# Patient Record
Sex: Female | Born: 1980 | Hispanic: Yes | Marital: Single | Smoking: Never smoker
Health system: Southern US, Community
[De-identification: ages and names within clinical notes are randomized; demographics above are authoritative.]

## PROBLEM LIST (undated history)

## (undated) DIAGNOSIS — R87629 Unspecified abnormal cytological findings in specimens from vagina: Secondary | ICD-10-CM

## (undated) DIAGNOSIS — R519 Headache, unspecified: Secondary | ICD-10-CM

## (undated) HISTORY — DX: Unspecified abnormal cytological findings in specimens from vagina: R87.629

---

## 2016-03-29 ENCOUNTER — Encounter (HOSPITAL_COMMUNITY): Payer: Self-pay | Admitting: *Deleted

## 2016-04-14 ENCOUNTER — Encounter (HOSPITAL_COMMUNITY): Payer: Self-pay | Admitting: *Deleted

## 2016-04-14 ENCOUNTER — Ambulatory Visit (HOSPITAL_COMMUNITY)
Admission: RE | Admit: 2016-04-14 | Discharge: 2016-04-14 | Disposition: A | Discharge status: Home / Self Care | Payer: Self-pay | Source: Ambulatory Visit | Attending: Obstetrics and Gynecology | Admitting: Obstetrics and Gynecology

## 2016-04-14 VITALS — BP 120/84 | Temp 98.9°F | Ht 63.0 in | Wt 143.8 lb

## 2016-04-14 DIAGNOSIS — R87612 Low grade squamous intraepithelial lesion on cytologic smear of cervix (LGSIL): Secondary | ICD-10-CM

## 2016-04-14 DIAGNOSIS — Z1239 Encounter for other screening for malignant neoplasm of breast: Secondary | ICD-10-CM

## 2016-04-14 NOTE — Progress Notes (Signed)
[facility] due to having an abnormal Pap smear and a colposcopy is recommended for follow up. Pap smear completed on 03/11/2016.  Pap Smear:  Pap smear not completed today. Last [facility]2016 [facility] and LGSIL with cells suspicious of HGSIL. Referred patient to the Center [facility] for a colposcopy to follow up for abnormal Pap smear. Appointment scheduled for Monday, May 02, 2016 at 1440. Per patient has no history of an abnormal Pap smear prior to the most recent Pap smear. Last Pap smear result is in EPIC.  Physical exam: Breasts Breasts symmetrical. No skin abnormalities bilateral breasts. No nipple retraction bilateral breasts. No nipple discharge bilateral breasts. No lymphadenopathy. No lumps palpated bilateral breasts. No complaints of pain or tenderness on exam. Screening mammogram recommended at age 740 unless clinically indicated prior.     Pelvic/Bimanual No Pap smear completed today since last Pap smear was 03/11/2016. Pap smear not indicated per BCCCP guidelines.   Smoking History: Patient has never smoked.  Patient Navigation: Patient education provided. Access to services provided for patient through [facility]. Spanish interpreter provided.  Used Spanish interpreter [employer] Herrera from [facility].

## 2016-04-14 NOTE — Patient Instructions (Signed)
Explained breast self awareness with Sheila Ellis. Patient did not need a Pap smear today due to last Pap smear was 03/11/2016. Explained the colposcopy the needed follow up for her abnormal Pap smear. Referred patient to the [facility] for a colposcopy to follow up for abnormal Pap smear. Appointment scheduled for Monday, May 02, 2016 at 1440. Patient aware of appointment and will be there. Let patient know will follow up with her within the next couple weeks with results. [name] verbalized understanding.  Brannock, Kathaleen Maserhristine Poll, RN 4:44 PM

## 2016-04-18 ENCOUNTER — Encounter (HOSPITAL_COMMUNITY): Payer: Self-pay | Admitting: *Deleted

## 2016-05-02 ENCOUNTER — Encounter: Payer: Self-pay | Admitting: Obstetrics and Gynecology

## 2016-05-19 ENCOUNTER — Other Ambulatory Visit (HOSPITAL_COMMUNITY)
Admission: RE | Admit: 2016-05-19 | Discharge: 2016-05-19 | Disposition: A | Discharge status: Home / Self Care | Payer: Self-pay | Source: Ambulatory Visit | Attending: Obstetrics and Gynecology | Admitting: Obstetrics and Gynecology

## 2016-05-19 ENCOUNTER — Ambulatory Visit (INDEPENDENT_AMBULATORY_CARE_PROVIDER_SITE_OTHER): Payer: Self-pay | Admitting: Obstetrics and Gynecology

## 2016-05-19 ENCOUNTER — Encounter: Payer: Self-pay | Admitting: Obstetrics and Gynecology

## 2016-05-19 VITALS — BP 138/93 | HR 84 | Wt 141.9 lb

## 2016-05-19 DIAGNOSIS — Z3202 Encounter for pregnancy test, result negative: Secondary | ICD-10-CM

## 2016-05-19 DIAGNOSIS — R87612 Low grade squamous intraepithelial lesion on cytologic smear of cervix (LGSIL): Secondary | ICD-10-CM

## 2016-05-19 LAB — POCT PREGNANCY, URINE: Preg Test, Ur: NEGATIVE

## 2016-05-19 NOTE — Progress Notes (Signed)
GYNECOLOGY CLINIC COLPOSCOPY VISIT AND PROCEDURE NOTE  35 y.o. G0P0000 here for colposcopy for LSIL concern for high grade  pap smear on 03/17/2016. Prior cervical cytology and/or colposcopy findings: never had PAP before. The patient reports the following prior treatments to the vulva/vagina/cervix: none. The patient not a cigarette smoker. The patient not immunosuppressed. The patient not pregnant. The patient not taking anticoagulants and not allergic to iodine.  Patient given informed consent, signed copy in the chart, time out was performed. Urine pregnancy test performed and confirmed to be negative.  Placed in lithotomy position.   Gross findings:   Vagina: normal  Vulva: normal  Cervix: abnormal see diagram, rolling edges with layered lesions, and abnormal vessels from 12 to 3 oclock  Visualization after:  Acetic acid: increased uptake near 12 oclock  Lugol's solution: normal uptake  Physical Exam  Genitourinary:         Biopsies obtained: 8 oclock, 10 oclock and 2 oclock  ECC specimen obtained: yes  Colposcopy adequate? Yes  All specimens were labelled and sent to pathology.   Patient was given post procedure instructions.  Will follow up pathology and manage accordingly.      Ernestina PennaNicholas Schenk, MD OB/GYN Fellow

## 2016-05-19 NOTE — Patient Instructions (Signed)
Colposcopa, Cuidado posterior (Colposcopy, Care After) La colposcopa es un procedimiento en el que se utiliza una herramienta especial para magnificar la superficie del cuello del tero. Tambin es posible que se tome una muestra de tejido (biopsia). Esta muestra se observar para identificar la presencia de cncer cervical u otros problemas. Despus del procedimiento:  Podr sentir algunos clicos.  Recustese algunos minutos si se siente mareada.  Podr tener un sangrado que debera detenerse luego de algunos das. CUIDADOS EN EL HOGAR  No tenga relaciones sexuales ni use tampones durante 2 o 3 das o segn le hayan indicado.  Slo tome medicamentos como lo indique su mdico.  Contine tomando las pastillas anticonceptivas de la forma habitual. Averige los resultados de su anlisis Pregunte cundo estarn listos los resultados del examen. Asegrese de obtener los resultados. SOLICITE AYUDA DE INMEDIATO SI:  Tiene un sangrado abundante o elimina cogulos.  Su temperatura es de 102 F (38.9 C) o mayor.  Observa una secrecin vaginal anormal.  Tiene clicos que no se van con los medicamentos.  Siente mareos, vrtigo o pierde el conocimiento (se desmaya). ASEGRESE DE QUE:   Comprende estas instrucciones.  Controlar su enfermedad.  Solicitar ayuda de inmediato si no mejora o si empeora.   Esta informacin no tiene como fin reemplazar el consejo del mdico. Asegrese de hacerle al mdico cualquier pregunta que tenga.   Document Released: 07/30/2010 Document Revised: 09/19/2011 Elsevier Interactive Patient Education 2016 Elsevier Inc.  

## 2016-05-30 ENCOUNTER — Telehealth: Payer: Self-pay | Admitting: Obstetrics and Gynecology

## 2016-05-30 NOTE — Telephone Encounter (Signed)
Patient with high grade lesion on her Colposcopy. Will need LEEP with large Fischer loop. Left message for patient to call back for results. If she calls back she will need scheduled for LEEP. This is per Dr. Vergie LivingPickens. Please schedule with me if possible.

## 2016-06-01 ENCOUNTER — Telehealth: Payer: Self-pay | Admitting: *Deleted

## 2016-06-01 NOTE — Telephone Encounter (Signed)
-----   Message from Lorne SkeensNicholas Michael Schenk, MD sent at 05/30/2016  3:08 PM EST ----- Attempted to reach patient with results left message to call back. High grade lesion on colposcopy. Will need LEEP. Please schedule with me. D/W Dr. Vergie LivingPickens. Please let me know if she has questions and I will attempt to reach.

## 2016-06-01 NOTE — Telephone Encounter (Signed)
Per Dr Genevie AnnSchenk, attempted to contact pt using Spanish interpreter # [phone]. There was no answer. Left message stating I am calling with test results. Please call the clinic.

## 2016-06-06 NOTE — Telephone Encounter (Signed)
Called Sheila Ellis with interpreter Maretta LosBlanca Lindner and explained colposcopy came back high grade and reccommendation is LEEP and that a registrar will call her with an appointment. She voices understanding.

## 2016-06-28 ENCOUNTER — Encounter: Payer: Self-pay | Admitting: Obstetrics and Gynecology

## 2016-07-06 ENCOUNTER — Telehealth: Payer: Self-pay | Admitting: *Deleted

## 2016-07-06 NOTE — Telephone Encounter (Signed)
error 

## 2016-08-10 ENCOUNTER — Encounter: Payer: Self-pay | Admitting: Obstetrics and Gynecology

## 2016-08-10 ENCOUNTER — Ambulatory Visit (INDEPENDENT_AMBULATORY_CARE_PROVIDER_SITE_OTHER): Payer: Self-pay | Admitting: Obstetrics and Gynecology

## 2016-08-10 ENCOUNTER — Other Ambulatory Visit (HOSPITAL_COMMUNITY)
Admission: RE | Admit: 2016-08-10 | Discharge: 2016-08-10 | Disposition: A | Discharge status: Home / Self Care | Payer: Self-pay | Source: Ambulatory Visit | Attending: Obstetrics and Gynecology | Admitting: Obstetrics and Gynecology

## 2016-08-10 VITALS — BP 151/84 | HR 79 | Wt 147.5 lb

## 2016-08-10 DIAGNOSIS — Z3202 Encounter for pregnancy test, result negative: Secondary | ICD-10-CM

## 2016-08-10 DIAGNOSIS — N871 Moderate cervical dysplasia: Secondary | ICD-10-CM

## 2016-08-10 LAB — POCT PREGNANCY, URINE
PREG TEST UR: NEGATIVE
Preg Test, Ur: NEGATIVE

## 2016-08-10 NOTE — Patient Instructions (Signed)
Conización del cuello del útero, cuidados posteriores °(Conization of the Cervix, Care After) °Estas indicaciones le proporcionan información acerca de cómo deberá cuidarse después del procedimiento. El médico también podrá darle instrucciones específicas. Comuníquese con el médico si tiene algún problema o tiene preguntas después del procedimiento. °CUIDADOS EN EL HOGAR °· Planifique para que alguien la lleve de vuelta a su casa después del procedimiento. °· Solo tome los medicamentos que le haya indicado su médico. No tome aspirina. Puede ocasionar hemorragias. °· Durante la primera semana tome duchas. No tome baños, no practique natación ni use el jacuzzi hasta que el médico la autorice. °· No se haga duchas vaginales, no utilice tampones ni tenga sexo (relaciones sexuales) hasta que el médico la autorice. °· Durante 7 a 14 días, evite: °? El exceso de actividad. °? Practicar actividad física. °? Levantar peso excesivo. °· Vuelva a su dieta habitual excepto que el médico le indique otra cosa. °· Si no puede mover el intestino (está estreñida), puede: °? Tomar un laxante suave según las indicaciones del médico. °? Agregar frutas y salvado a su dieta. °? Asegurarse de ingerir gran cantidad de líquido para mantener el pis (orina) de tono claro o color amarillo pálido. °· Cumpla con las visitas de control según le indique su médico. ° °SOLICITE AYUDA SI: °· Tiene una erupción cutánea. °· Se siente mareada o sufre un desmayo. °· Siente malestar estomacal (náuseas). °· Tiene una secreción vaginal con mal olor. ° °SOLICITE AYUDA DE INMEDIATO SI: °· Tiene coágulos de sangre o una hemorragia más abundante que un período menstrual normal. Por ejemplo, empapa un apósito en menos de 1 hora. °· Tiene un sangrado rojo brillante. °· Tiene fiebre de más de 101 °F (38,3 °C) o síntomas persistentes durante más de 2 o 3 días. °· Tiene fiebre de más de 101 °F (38,3 °C) y los síntomas empeoran repentinamente. °· Siente cada vez más  cólicos. °· Pierde el conocimiento (se desmaya). °· Siente dolor al orinar. °· La orina tiene sangre. °· Comienza a devolver (vomita). °· El dolor no mejora al tomar los medicamentos. °· Siente mucho dolor. °· El dolor empeora. ° °ASEGÚRESE DE QUE: °· Comprende estas instrucciones. °· Controlará su afección. °· Recibirá ayuda de inmediato si no mejora o si empeora. ° °Esta información no tiene como fin reemplazar el consejo del médico. Asegúrese de hacerle al médico cualquier pregunta que tenga. °Document Released: 07/30/2010 Document Revised: 07/02/2013 Document Reviewed: 12/21/2012 °Elsevier Interactive Patient Education © 2017 Elsevier Inc. ° °

## 2016-08-10 NOTE — Progress Notes (Signed)
   GYNECOLOGY OFFICE PROCEDURE NOTE  Sheila Ellis is a 36 y.o. G0P0000 here for LEEP. No GYN concerns. Pap smear and colposcopy reviewed.    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  Role of HPV,cervical dysplasia and need for close followup was empasized. Informed written consent was obtained. All questions were answered. Time out performed. Urine pregnancy test was negative.  Procedure: The patient was placed in lithotomy position and the bivalved coated speculum was placed in the patient's vagina. A grounding pad placed on the patient. Lugol's solution was applied to the cervix and areas of decreased uptake were noted around the transformation zone.   Local anesthesia was administered via an intracervical block using 10cc of 2% Lidocaine with epinephrine. The suction was turned on and the Medium 1X Fisher Cone Biopsy Excisor on 50 Watts of blended current was used to excise the area of decreased uptake and excise the entire transformation zone. Excellent hemostasis was achieved using roller ball coagulation set at 50 Watts coagulation current. Monsel's solution was then applied and the speculum was removed from the vagina. Specimens were sent to pathology.  The patient tolerated the procedure well. Post-operative instructions given to patient, including instruction to seek medical attention for persistent bright red bleeding, fever, abdominal/pelvic pain, dysuria, nausea or vomiting. She was also told about the possibility of having copious yellow to black tinged discharge for weeks. She was counseled to avoid anything in the vagina (sex/douching/tampons) for 3 weeks. She has a 4 week post-operative check  to assess wound healing, review results and discuss further management.     Ernestina PennaNicholas Lamondre Wesche, MD OB Fellow, [facility]

## 2016-08-10 NOTE — Addendum Note (Signed)
Addended by: Garret ReddishBARNES, Iris Hairston M on: 08/10/2016 04:39 PM   Modules accepted: Orders

## 2016-08-17 ENCOUNTER — Telehealth: Payer: Self-pay | Admitting: Obstetrics and Gynecology

## 2016-08-17 NOTE — Telephone Encounter (Signed)
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 informed she will need to get repeat PAP with co-testing at 12 and 24 months. Patient voiced understanding.

## 2016-08-23 ENCOUNTER — Telehealth (HOSPITAL_COMMUNITY): Payer: Self-pay | Admitting: *Deleted

## 2016-08-23 NOTE — Telephone Encounter (Signed)
Telephoned patient at home number and advised patient would need to fill out financial assistance  paperwork for LEEP procedurebecasue was not covered by BCCCP. Patient is not citizen and is not eligible for [employer] medicaid. Patient voiced understanding. Used interpreter Delorise RoyalsJulie Sowell.

## 2017-07-11 HISTORY — PX: DILATION AND CURETTAGE OF UTERUS: SHX78

## 2017-11-07 ENCOUNTER — Ambulatory Visit (HOSPITAL_COMMUNITY): Payer: Self-pay

## 2019-09-17 ENCOUNTER — Emergency Department
Admission: EM | Admit: 2019-09-17 | Discharge: 2019-09-17 | Disposition: A | Discharge status: Home / Self Care | Payer: Self-pay | Attending: Emergency Medicine | Admitting: Emergency Medicine

## 2019-09-17 ENCOUNTER — Encounter: Payer: Self-pay | Admitting: Emergency Medicine

## 2019-09-17 ENCOUNTER — Emergency Department: Payer: Self-pay

## 2019-09-17 ENCOUNTER — Other Ambulatory Visit: Payer: Self-pay

## 2019-09-17 ENCOUNTER — Other Ambulatory Visit: Payer: Self-pay | Admitting: Podiatry

## 2019-09-17 DIAGNOSIS — Z23 Encounter for immunization: Secondary | ICD-10-CM | POA: Insufficient documentation

## 2019-09-17 DIAGNOSIS — X501XXA Overexertion from prolonged static or awkward postures, initial encounter: Secondary | ICD-10-CM | POA: Insufficient documentation

## 2019-09-17 DIAGNOSIS — S98131A Complete traumatic amputation of one right lesser toe, initial encounter: Secondary | ICD-10-CM | POA: Insufficient documentation

## 2019-09-17 DIAGNOSIS — Y998 Other external cause status: Secondary | ICD-10-CM | POA: Insufficient documentation

## 2019-09-17 DIAGNOSIS — Y92012 Bathroom of single-family (private) house as the place of occurrence of the external cause: Secondary | ICD-10-CM | POA: Insufficient documentation

## 2019-09-17 DIAGNOSIS — Y9389 Activity, other specified: Secondary | ICD-10-CM | POA: Insufficient documentation

## 2019-09-17 MED ORDER — TETANUS-DIPHTH-ACELL PERTUSSIS 5-2.5-18.5 LF-MCG/0.5 IM SUSP
0.5000 mL | Freq: Once | INTRAMUSCULAR | Status: AC
  Administered 2019-09-17: 11:00:00 0.5 mL via INTRAMUSCULAR
  Filled 2019-09-17: qty 0.5

## 2019-09-17 MED ORDER — OXYCODONE-ACETAMINOPHEN 5-325 MG PO TABS
1.0000 | ORAL_TABLET | Freq: Once | ORAL | Status: AC
  Administered 2019-09-17: 10:00:00 1 via ORAL
  Filled 2019-09-17: qty 1

## 2019-09-17 MED ORDER — AMOXICILLIN-POT CLAVULANATE 875-125 MG PO TABS
1.0000 | ORAL_TABLET | Freq: Once | ORAL | Status: AC
  Administered 2019-09-17: 11:00:00 1 via ORAL
  Filled 2019-09-17: qty 1

## 2019-09-17 MED ORDER — AMOXICILLIN-POT CLAVULANATE 875-125 MG PO TABS: 1.0000 | ORAL_TABLET | Freq: Two times a day (BID) | ORAL | 0 refills | Status: DC

## 2019-09-17 NOTE — ED Provider Notes (Signed)
[facility] Emergency Department Provider Note ____________________________________________   First MD Initiated Contact with Patient 09/17/19 [phone]     (approximate)  I have reviewed the triage vital signs and the nursing notes.   HISTORY  Chief Complaint Foot Injury  History of present illness and review of systems obtained via in-person Spanish interpreter  HPI Sheila Ellis is a 39 y.o. female who presents with right second toe injury, acute onset when the patient slipped while getting out of the shower and the foot went into a bent in the floor.  When the patient her foot out, the vents were off a piece of the second toe.  She denies any other injuries.  Past Medical History:  Diagnosis Date  . Vaginal Pap smear, abnormal     There are no problems to display for this patient.   History reviewed. No pertinent surgical history.  Prior to Admission medications   Medication Sig Start Date End Date Taking? Authorizing Provider  acetaminophen (TYLENOL) 500 MG tablet Take 500 mg by mouth every 6 (six) hours as needed for mild pain, moderate pain or headache.    [provider]  amoxicillin-clavulanate (AUGMENTIN) 875-125 MG tablet Take 1 tablet by mouth 2 (two) times daily for 7 days. 09/17/19 09/24/19  Arta Silence, MD  medroxyPROGESTERone (DEPO-PROVERA) 400 MG/ML SUSP injection Inject into the muscle once.    [provider]  Multiple Vitamin (MULTIVITAMIN) tablet Take 1 tablet by mouth daily.    [provider]    Allergies Patient has no known allergies.  Family History  Problem Relation Age of Onset  . Diabetes Mother   . Cancer Mother        LIVER    Social History Social History   Tobacco Use  . Smoking status: Never Smoker  . Smokeless tobacco: Never Used  Substance Use Topics  . Alcohol use: Yes    Comment: RARELY  . Drug use: No    Review of Systems  Constitutional: No fever. Eyes: No  redness. ENT: No neck pain. Cardiovascular: Denies chest pain. Respiratory: Denies shortness of breath. Gastrointestinal: No vomiting. Genitourinary: Negative for flank pain.  Musculoskeletal: Negative for back pain. Skin: Negative for rash. Neurological: Negative for headache.   ____________________________________________   PHYSICAL EXAM:  VITAL SIGNS: ED Triage Vitals  Enc Vitals Group     BP 09/17/19 0858 97/71     Pulse Rate 09/17/19 0858 96     Resp 09/17/19 0858 16     Temp 09/17/19 0858 98.4 F (36.9 C)     Temp Source 09/17/19 0858 Oral     SpO2 09/17/19 0858 98 %     Weight 09/17/19 0859 180 lb (81.6 kg)     Height 09/17/19 0859 5\' 5"  (1.651 m)     Head Circumference --      Peak Flow --      Pain Score 09/17/19 0859 9     Pain Loc --      Pain Edu? --      Excl. in [place]? --     Constitutional: Alert and oriented. Well appearing and in no acute distress. Eyes: Conjunctivae are normal.  Head: Atraumatic. Nose: No congestion/rhinnorhea. Mouth/Throat: Mucous membranes are moist.   Neck: Normal range of motion.  Cardiovascular: Normal rate, regular rhythm.  Good peripheral circulation. Respiratory: Normal respiratory effort.  No retractions.  Gastrointestinal: No distention.  Musculoskeletal: Extremities warm and well perfused.  Right second toe with avulsion of  the distal soft tissue with phalanx exposed. Neurologic:  Normal speech and language. No gross focal neurologic deficits are appreciated.  Skin:  Skin is warm and dry. No rash noted. Psychiatric: Mood and affect are normal. Speech and behavior are normal.  ____________________________________________   LABS (all labs ordered are listed, but only abnormal results are displayed)  Labs Reviewed - No data to display ____________________________________________  EKG   ____________________________________________  RADIOLOGY  XR R foot: Soft tissue and distal phalanx avulsion consistent  mutation  ____________________________________________   PROCEDURES  Procedure(s) performed: No  Procedures  Critical Care performed: No ____________________________________________   INITIAL IMPRESSION / ASSESSMENT AND PLAN / ED COURSE  Pertinent labs & imaging results that were available during my care of the patient were reviewed by me and considered in my medical decision making (see chart for details).  39 year old female with PMH as noted above presents with right second toe avulsion, with exposed bone.  She has no other injuries.  We will obtain an x-ray, give analgesia, and consult podiatry.  ----------------------------------------- 11:41 AM on 09/17/2019 -----------------------------------------  X-ray confirms avulsion of the distal end of the distal phalanx.  I consulted Dr. Excell Seltzer from the podiatry service.  He recommended that we washed out the wound thoroughly, start the patient on an antibiotic, and have her proceed directly over to their clinic to see Dr. Ether Griffins this afternoon and be scheduled for condition with next 1 to 2 days.  The patient was given Augmentin and a tetanus shot in the ED.  The wound was thoroughly irrigated and wrapped in a sterile dressing by the RN.  The patient was then instructed to proceed to podiatry.  Discharge instructions were given via in-person Spanish interpreter and the patient expressed understanding.  Answered all of her questions.  ____________________________________________   FINAL CLINICAL IMPRESSION(S) / ED DIAGNOSES  Final diagnoses:  Amputated toe of right foot (HCC)      NEW MEDICATIONS STARTED DURING THIS VISIT:  New Prescriptions   AMOXICILLIN-CLAVULANATE (AUGMENTIN) 875-125 MG TABLET    Take 1 tablet by mouth 2 (two) times daily for 7 days.     Note:  This document was prepared using Dragon voice recognition software and may include unintentional dictation errors.    Dionne Bucy, MD 09/17/19  [phone]

## 2019-09-17 NOTE — ED Triage Notes (Signed)
Pt in via POV, reports slipping while coming out of the shower, foot becoming lodged into AC vent.  Tip of right second toe completely severed, bone protruding, bleeding controlled at this time.

## 2019-09-17 NOTE — ED Notes (Signed)
Xray and MD at bedside.

## 2019-09-17 NOTE — ED Notes (Signed)
Pt affected toe cleaned with sterile saline and betadine per MD Siadecki. Toe wrapped in xeroform and guaze. No active bleeding at the site.

## 2019-09-18 ENCOUNTER — Other Ambulatory Visit
Admission: RE | Admit: 2019-09-18 | Discharge: 2019-09-18 | Disposition: A | Discharge status: Home / Self Care | Payer: HRSA Program | Source: Ambulatory Visit | Attending: Podiatry | Admitting: Podiatry

## 2019-09-18 DIAGNOSIS — Z20822 Contact with and (suspected) exposure to covid-19: Secondary | ICD-10-CM | POA: Insufficient documentation

## 2019-09-18 LAB — SARS CORONAVIRUS 2 (TAT 6-24 HRS): SARS Coronavirus 2: NEGATIVE

## 2019-09-19 ENCOUNTER — Other Ambulatory Visit: Payer: Self-pay

## 2019-09-19 ENCOUNTER — Encounter
Admission: RE | Admit: 2019-09-19 | Discharge: 2019-09-19 | Disposition: A | Discharge status: Home / Self Care | Payer: Self-pay | Source: Ambulatory Visit | Attending: Podiatry | Admitting: Podiatry

## 2019-09-19 DIAGNOSIS — Z01818 Encounter for other preprocedural examination: Secondary | ICD-10-CM | POA: Insufficient documentation

## 2019-09-19 HISTORY — DX: Headache, unspecified: R51.9

## 2019-09-19 NOTE — Patient Instructions (Signed)
Your procedure is scheduled on: Friday September 20, 2019 Su procedimiento est programado para: Viernes 12 de Sheila Ellis del 2021 Report to Day Surgery. Presntese a: Sheila Ellis  To find out your arrival time please call [phone] between 1PM - 3PM on Thursday September 20, 2019. Para saber su hora de llegada por favor llame al [phone] Sheila Ellis Me la 1PM - 3PM el da: [name] 12 de [name] del 2021.   Remember: Instructions that are not followed completely may result in serious medical risk, up to and including death,  or upon the discretion of your surgeon and anesthesiologist your surgery may need to be rescheduled.  Recuerde: Las instrucciones que no se siguen completamente Armed forces logistics/support/administrative officer en un riesgo de salud grave, incluyendo hasta  la [place] o a discrecin de su cirujano y Scientific laboratory technician, su ciruga se puede posponer.   __X_ 1.Do not eat food after midnight the night before your procedure. No    gum chewing or hard candies. You may drink clear liquids up to 2 hours     before you are scheduled to arrive for your surgery- DO not drink clear     Liquids within 2 hours of the start of your surgery.     Clear Liquids include:    water, apple juice without pulp, clear carbohydrate drink such as    Clearfast of [place], Black Coffee or Tea (Do not add anything to coffee or tea).   NO sodas or carbonated drinks.      No coma nada despus de la medianoche de la noche anterior a su    procedimiento. No coma chicles ni caramelos duros. Puede tomar    lquidos claros hasta 2 horas antes de su hora programada de llegada al     hospital para su procedimiento. No tome lquidos claros durante el     transcurso de las 2 horas de su llegada programada al hospital para su     procedimiento, ya que esto puede llevar a que su procedimiento se    retrase o tenga que volver a Magazine features editor.  Los lquidos claros incluyen:          - Agua o jugo de [place] sin pulpa          - Bebidas claras con  carbohidratos como ClearFast o Gatorade          - Caf negro o t claro (sin leche, sin cremas, no agregue nada al caf ni al t)  No tome nada que no est en esta lista.  Los pacientes con diabetes tipo 1 y tipo 2 solo deben Printmaker.  Llame a la clnica de PreCare o a la unidad de Same Day Surgery si  tiene alguna pregunta sobre estas instrucciones. NO refrescos o bedidas carbonadas.              _X__ 2.Do Not Smoke or use e-cigarettes For 24 Hours Prior to Your Surgery.    Do not use any chewable tobacco products for at least 6   hours prior to surgery.    No fume ni use cigarrillos electrnicos durante las 24 horas previas    a su [place].  No use ningn producto de tabaco masticable durante   al menos 6 horas antes de la [place].     __X_ 3. No alcohol for 24 hours before or after surgery.    No tome alcohol durante las 24 horas antes ni despus de la [place].   __x__ 5. Notify your doctor if there  is any change in your medical condition (cold,fever, infections).    Informe a su mdico si hay algn cambio en su condicin mdica  (resfriado, fiebre, infecciones).   Do not wear jewelry, make-up, hairpins, clips or nail polish.  No use joyas, maquillajes, pinzas/ganchos para el cabello ni esmalte de uas.  Do not wear lotions, powders, or perfumes. You may wear deodorant.  No use lociones, polvos o perfumes.  Puede usar desodorante.    Do not shave 48 hours prior to surgery. Men may shave face and neck.  No se afeite 48 horas antes de la [place].  Los hombres pueden [employer] cara  y el cuello.   Do not bring valuables to the hospital.   No lleve objetos [place] is not responsible for any belongings or valuables.  [place] no se hace responsable de ningn tipo de pertenencias u objetos de Geographical information systems officer.               Contacts, dentures or bridgework may not be worn into surgery.  Los lentes de [place], las dentaduras postizas o puentes no se pueden  usar en la [place].   For patients admitted to the hospital, discharge time is determined by your  treatment team.  Para los pacientes que sean ingresados al hospital, el tiempo en el cual se le  dar de alta es determinado por su equipo de [place].   Patients discharged the day of surgery will not be allowed to drive home. A los pacientes que se les da de alta el mismo da de la ciruga no se les permitir conducir a Holiday representative.     __x__ Take these medicines the morning of surgery with A SIP OF WATER:          Tome estas medicinas la maana de la ciruga con UN SORBO DE AGUA:  1. acetaminophen (TYLENOL)   __x__ Use CHG Soap as directed          Utilice el jabn de CHG segn lo indicado   __x__ Stop Anti-inflammatories on ibuprofen, aspirin, Aleve, polvos de BC powders.           Deje de tomar Regulatory affairs officer:   __x__ Stop supplements until after surgery            Deje de tomar suplementos hasta despus de la ciruga  __x__ Do not start herbal supplements before your surgery.  No empieze a tomar suplementos de hierbas antes de la [place].

## 2019-09-20 ENCOUNTER — Ambulatory Visit
Admission: RE | Admit: 2019-09-20 | Discharge: 2019-09-20 | Disposition: A | Discharge status: Home / Self Care | Payer: Self-pay | Attending: Podiatry | Admitting: Podiatry

## 2019-09-20 ENCOUNTER — Other Ambulatory Visit: Payer: Self-pay

## 2019-09-20 ENCOUNTER — Ambulatory Visit: Payer: Self-pay | Admitting: Certified Registered Nurse Anesthetist

## 2019-09-20 ENCOUNTER — Encounter: Payer: Self-pay | Admitting: Podiatry

## 2019-09-20 ENCOUNTER — Encounter
Admission: RE | Disposition: A | Discharge status: Home / Self Care | Payer: Self-pay | Source: Home / Self Care | Attending: Podiatry

## 2019-09-20 DIAGNOSIS — Z79899 Other long term (current) drug therapy: Secondary | ICD-10-CM | POA: Insufficient documentation

## 2019-09-20 DIAGNOSIS — Y9389 Activity, other specified: Secondary | ICD-10-CM | POA: Insufficient documentation

## 2019-09-20 DIAGNOSIS — R519 Headache, unspecified: Secondary | ICD-10-CM | POA: Insufficient documentation

## 2019-09-20 DIAGNOSIS — Z8249 Family history of ischemic heart disease and other diseases of the circulatory system: Secondary | ICD-10-CM | POA: Insufficient documentation

## 2019-09-20 DIAGNOSIS — S92531B Displaced fracture of distal phalanx of right lesser toe(s), initial encounter for open fracture: Secondary | ICD-10-CM | POA: Insufficient documentation

## 2019-09-20 DIAGNOSIS — Z800 Family history of malignant neoplasm of digestive organs: Secondary | ICD-10-CM | POA: Insufficient documentation

## 2019-09-20 DIAGNOSIS — Z833 Family history of diabetes mellitus: Secondary | ICD-10-CM | POA: Insufficient documentation

## 2019-09-20 DIAGNOSIS — X58XXXA Exposure to other specified factors, initial encounter: Secondary | ICD-10-CM | POA: Insufficient documentation

## 2019-09-20 HISTORY — PX: AMPUTATION TOE: SHX6595

## 2019-09-20 LAB — POCT PREGNANCY, URINE: Preg Test, Ur: NEGATIVE

## 2019-09-20 SURGERY — AMPUTATION, TOE
Anesthesia: General | Site: Toe | Laterality: Right

## 2019-09-20 MED ORDER — EPHEDRINE SULFATE 50 MG/ML IJ SOLN
INTRAMUSCULAR | Status: DC | PRN
  Administered 2019-09-20: 5 mg via INTRAVENOUS

## 2019-09-20 MED ORDER — OXYCODONE HCL 5 MG PO TABS: 5.0000 mg | ORAL_TABLET | Freq: Once | ORAL | Status: DC | PRN

## 2019-09-20 MED ORDER — ACETAMINOPHEN 10 MG/ML IV SOLN
INTRAVENOUS | Status: DC | PRN
  Administered 2019-09-20: 1000 mg via INTRAVENOUS

## 2019-09-20 MED ORDER — FAMOTIDINE 20 MG PO TABS: 20.0000 mg | ORAL_TABLET | Freq: Once | ORAL | Status: AC

## 2019-09-20 MED ORDER — CEFAZOLIN SODIUM-DEXTROSE 2-4 GM/100ML-% IV SOLN
INTRAVENOUS | Status: AC
  Filled 2019-09-20: qty 100

## 2019-09-20 MED ORDER — CLINDAMYCIN HCL 300 MG PO CAPS: 300.0000 mg | ORAL_CAPSULE | Freq: Three times a day (TID) | ORAL | 0 refills | Status: AC

## 2019-09-20 MED ORDER — ONDANSETRON HCL 4 MG/2ML IJ SOLN: 4.0000 mg | Freq: Once | INTRAMUSCULAR | Status: DC | PRN

## 2019-09-20 MED ORDER — MIDAZOLAM HCL 2 MG/2ML IJ SOLN
INTRAMUSCULAR | Status: DC | PRN
  Administered 2019-09-20: 2 mg via INTRAVENOUS

## 2019-09-20 MED ORDER — LACTATED RINGERS IV SOLN: INTRAVENOUS | Status: DC

## 2019-09-20 MED ORDER — FENTANYL CITRATE (PF) 100 MCG/2ML IJ SOLN
INTRAMUSCULAR | Status: AC
  Filled 2019-09-20: qty 2

## 2019-09-20 MED ORDER — PROPOFOL 10 MG/ML IV BOLUS
INTRAVENOUS | Status: DC | PRN
  Administered 2019-09-20: 40 mg via INTRAVENOUS

## 2019-09-20 MED ORDER — CLINDAMYCIN PHOSPHATE 600 MG/50ML IV SOLN
600.0000 mg | Freq: Once | INTRAVENOUS | Status: AC
  Administered 2019-09-20: 600 mg via INTRAVENOUS

## 2019-09-20 MED ORDER — BUPIVACAINE HCL (PF) 0.5 % IJ SOLN
INTRAMUSCULAR | Status: AC
  Filled 2019-09-20: qty 30

## 2019-09-20 MED ORDER — ACETAMINOPHEN 10 MG/ML IV SOLN
INTRAVENOUS | Status: AC
  Filled 2019-09-20: qty 100

## 2019-09-20 MED ORDER — BUPIVACAINE HCL 0.5 % IJ SOLN
INTRAMUSCULAR | Status: DC | PRN
  Administered 2019-09-20: 5 mL

## 2019-09-20 MED ORDER — PROPOFOL 10 MG/ML IV BOLUS
INTRAVENOUS | Status: AC
  Filled 2019-09-20: qty 20

## 2019-09-20 MED ORDER — CEFAZOLIN SODIUM-DEXTROSE 2-4 GM/100ML-% IV SOLN: 2.0000 g | INTRAVENOUS | Status: DC

## 2019-09-20 MED ORDER — OXYCODONE HCL 5 MG/5ML PO SOLN: 5.0000 mg | Freq: Once | ORAL | Status: DC | PRN

## 2019-09-20 MED ORDER — FENTANYL CITRATE (PF) 100 MCG/2ML IJ SOLN: 25.0000 ug | INTRAMUSCULAR | Status: DC | PRN

## 2019-09-20 MED ORDER — ONDANSETRON HCL 4 MG/2ML IJ SOLN: 4.0000 mg | Freq: Four times a day (QID) | INTRAMUSCULAR | Status: DC | PRN

## 2019-09-20 MED ORDER — EPHEDRINE 5 MG/ML INJ
INTRAVENOUS | Status: AC
  Filled 2019-09-20: qty 10

## 2019-09-20 MED ORDER — POVIDONE-IODINE 7.5 % EX SOLN
Freq: Once | CUTANEOUS | Status: DC
  Filled 2019-09-20: qty 118

## 2019-09-20 MED ORDER — CLINDAMYCIN PHOSPHATE 600 MG/50ML IV SOLN
INTRAVENOUS | Status: AC
  Filled 2019-09-20: qty 50

## 2019-09-20 MED ORDER — FENTANYL CITRATE (PF) 100 MCG/2ML IJ SOLN
INTRAMUSCULAR | Status: DC | PRN
  Administered 2019-09-20 (×4): 25 ug via INTRAVENOUS

## 2019-09-20 MED ORDER — ONDANSETRON HCL 4 MG PO TABS: 4.0000 mg | ORAL_TABLET | Freq: Four times a day (QID) | ORAL | Status: DC | PRN

## 2019-09-20 MED ORDER — LIDOCAINE HCL (PF) 1 % IJ SOLN
INTRAMUSCULAR | Status: DC | PRN
  Administered 2019-09-20: 5 mL

## 2019-09-20 MED ORDER — FAMOTIDINE 20 MG PO TABS
ORAL_TABLET | ORAL | Status: AC
  Administered 2019-09-20: 20 mg via ORAL
  Filled 2019-09-20: qty 1

## 2019-09-20 MED ORDER — ACETAMINOPHEN 10 MG/ML IV SOLN: 1000.0000 mg | Freq: Once | INTRAVENOUS | Status: DC | PRN

## 2019-09-20 MED ORDER — PROPOFOL 500 MG/50ML IV EMUL
INTRAVENOUS | Status: DC | PRN
  Administered 2019-09-20: 100 ug/kg/min via INTRAVENOUS

## 2019-09-20 MED ORDER — MIDAZOLAM HCL 2 MG/2ML IJ SOLN
INTRAMUSCULAR | Status: AC
  Filled 2019-09-20: qty 2

## 2019-09-20 SURGICAL SUPPLY — 43 items
BLADE OSC/SAGITTAL MD 5.5X18 (BLADE) ×2 IMPLANT
BLADE SURG MINI STRL (BLADE) ×2 IMPLANT
BNDG CONFORM 2 STRL LF (GAUZE/BANDAGES/DRESSINGS) IMPLANT
BNDG CONFORM 3 STRL LF (GAUZE/BANDAGES/DRESSINGS) ×4 IMPLANT
BNDG ELASTIC 4X5.8 VLCR NS LF (GAUZE/BANDAGES/DRESSINGS) ×2 IMPLANT
BNDG ESMARK 4X12 TAN STRL LF (GAUZE/BANDAGES/DRESSINGS) ×2 IMPLANT
BNDG GAUZE 4.5X4.1 6PLY STRL (MISCELLANEOUS) ×2 IMPLANT
CANISTER SUCT 1200ML W/VALVE (MISCELLANEOUS) ×2 IMPLANT
COVER WAND RF STERILE (DRAPES) ×2 IMPLANT
CUFF TOURN SGL QUICK 12 (TOURNIQUET CUFF) IMPLANT
CUFF TOURN SGL QUICK 18X4 (TOURNIQUET CUFF) IMPLANT
DRAPE FLUOR MINI C-ARM 54X84 (DRAPES) IMPLANT
DRAPE XRAY CASSETTE 23X24 (DRAPES) IMPLANT
DURAPREP 26ML APPLICATOR (WOUND CARE) ×2 IMPLANT
ELECT REM PT RETURN 9FT ADLT (ELECTROSURGICAL) ×2
ELECTRODE REM PT RTRN 9FT ADLT (ELECTROSURGICAL) ×1 IMPLANT
GAUZE PACKING IODOFORM 1/2 (PACKING) IMPLANT
GAUZE SPONGE 4X4 12PLY STRL (GAUZE/BANDAGES/DRESSINGS) ×2 IMPLANT
GAUZE XEROFORM 1X8 LF (GAUZE/BANDAGES/DRESSINGS) ×2 IMPLANT
GLOVE BIO SURGEON STRL SZ7.5 (GLOVE) ×2 IMPLANT
GLOVE INDICATOR 8.0 STRL GRN (GLOVE) ×2 IMPLANT
GOWN STRL REUS W/ TWL LRG LVL3 (GOWN DISPOSABLE) ×2 IMPLANT
GOWN STRL REUS W/TWL LRG LVL3 (GOWN DISPOSABLE) ×2
KIT TURNOVER KIT A (KITS) ×2 IMPLANT
LABEL OR SOLS (LABEL) ×2 IMPLANT
NEEDLE FILTER BLUNT 18X 1/2SAF (NEEDLE) ×1
NEEDLE FILTER BLUNT 18X1 1/2 (NEEDLE) ×1 IMPLANT
NEEDLE HYPO 25X1 1.5 SAFETY (NEEDLE) ×2 IMPLANT
NS IRRIG 500ML POUR BTL (IV SOLUTION) ×2 IMPLANT
PACK EXTREMITY ARMC (MISCELLANEOUS) ×2 IMPLANT
PAD ABD DERMACEA PRESS 5X9 (GAUZE/BANDAGES/DRESSINGS) ×2 IMPLANT
PULSAVAC PLUS IRRIG FAN TIP (DISPOSABLE)
SHIELD FULL FACE ANTIFOG 7M (MISCELLANEOUS) ×2 IMPLANT
SOL .9 NS 3000ML IRR  AL (IV SOLUTION)
SOL .9 NS 3000ML IRR UROMATIC (IV SOLUTION) IMPLANT
STOCKINETTE M/LG 89821 (MISCELLANEOUS) ×2 IMPLANT
STRAP SAFETY 5IN WIDE (MISCELLANEOUS) ×2 IMPLANT
SUT ETHILON 3-0 FS-10 30 BLK (SUTURE) ×2
SUT ETHILON 5-0 FS-2 18 BLK (SUTURE) ×2 IMPLANT
SUT VIC AB 4-0 FS2 27 (SUTURE) ×2 IMPLANT
SUTURE EHLN 3-0 FS-10 30 BLK (SUTURE) ×1 IMPLANT
SYR 10ML LL (SYRINGE) ×6 IMPLANT
TIP FAN IRRIG PULSAVAC PLUS (DISPOSABLE) IMPLANT

## 2019-09-20 NOTE — Anesthesia Postprocedure Evaluation (Signed)
Anesthesia Post Note  Patient: Sheila Ellis  Procedure(s) Performed: AMPUTATION TOE IPJ RIGHT 2ND (Right Toe)  Patient location during evaluation: PACU Anesthesia Type: General Level of consciousness: awake and alert Pain management: pain level controlled Vital Signs Assessment: post-procedure vital signs reviewed and stable Respiratory status: spontaneous breathing, nonlabored ventilation, respiratory function stable and patient connected to nasal cannula oxygen Cardiovascular status: blood pressure returned to baseline and stable Postop Assessment: no apparent nausea or vomiting Anesthetic complications: no     Last Vitals:  Vitals:   09/20/19 0738 09/20/19 0921  BP: (!) 143/78 (!) 106/58  Pulse: 81 78  Resp: 16 13  Temp: 36.8 C (!) 36.3 C  SpO2: 100% 100%    Last Pain:  Vitals:   09/20/19 0921  TempSrc:   PainSc: 0-No pain                 Corinda Gubler

## 2019-09-20 NOTE — H&P (Signed)
HISTORY AND PHYSICAL INTERVAL NOTE:  09/20/2019  8:38 AM  Sheila Ellis  has presented today for surgery, with the diagnosis of S91.109A  DEGLOVING INJURY OF TOE.  The various methods of treatment have been discussed with the patient.  No guarantees were given.  After consideration of risks, benefits and other options for treatment, the patient has consented to surgery.  I have reviewed the patients' chart and labs.     A history and physical examination was performed in my office.  The patient was reexamined.  There have been no changes to this history and physical examination.  Gwyneth Revels A

## 2019-09-20 NOTE — Anesthesia Procedure Notes (Signed)
Performed by: Bj Morlock, CRNA Pre-anesthesia Checklist: Patient identified, Emergency Drugs available, Suction available, Patient being monitored and Timeout performed Patient Re-evaluated:Patient Re-evaluated prior to induction Oxygen Delivery Method: Simple face mask Induction Type: IV induction       

## 2019-09-20 NOTE — Transfer of Care (Signed)
Immediate Anesthesia Transfer of Care Note  Patient: Sheila Ellis  Procedure(s) Performed: AMPUTATION TOE IPJ RIGHT 2ND (Right Toe)  Patient Location: PACU  Anesthesia Type:MAC  Level of Consciousness: awake and alert   Airway & Oxygen Therapy: Patient Spontanous Breathing and Patient connected to face mask oxygen  Post-op Assessment: Report given to RN and Post -op Vital signs reviewed and stable  Post vital signs: Reviewed and stable  Last Vitals:  Vitals Value Taken Time  BP 106/58 09/20/19 0919  Temp    Pulse 82 09/20/19 0918  Resp 15 09/20/19 0918  SpO2 100 % 09/20/19 0918  Vitals shown include unvalidated device data.  Last Pain:  Vitals:   09/20/19 0738  TempSrc: Temporal  PainSc: 5       Patients Stated Pain Goal: 3 (03/40/35 2481)  Complications: No apparent anesthesia complications

## 2019-09-20 NOTE — Anesthesia Preprocedure Evaluation (Addendum)
Anesthesia Evaluation  Patient identified by MRN, date of birth, ID band Patient awake    Reviewed: Allergy & Precautions, NPO status , Patient's Chart, lab work & pertinent test results  History of Anesthesia Complications Negative for: history of anesthetic complications  Airway Mallampati: III  TM Distance: >3 FB Neck ROM: Full    Dental no notable dental hx. (+) Teeth Intact   Pulmonary neg pulmonary ROS, neg sleep apnea, neg COPD, Patient abstained from smoking.Not current smoker,    Pulmonary exam normal breath sounds clear to auscultation       Cardiovascular Exercise Tolerance: Good METS(-) hypertension(-) CAD and (-) Past MI negative cardio ROS  (-) dysrhythmias  Rhythm:Regular Rate:Normal - Systolic murmurs    Neuro/Psych  Headaches, negative psych ROS   GI/Hepatic neg GERD  ,(+)     (-) substance abuse  ,   Endo/Other  neg diabetes  Renal/GU negative Renal ROS     Musculoskeletal   Abdominal   Peds  Hematology   Anesthesia Other Findings Past Medical History: No date: Headache     Comment:  when stressed  No date: Vaginal Pap smear, abnormal  Reproductive/Obstetrics                             Anesthesia Physical Anesthesia Plan  ASA: I  Anesthesia Plan: General   Post-op Pain Management:    Induction: Intravenous  PONV Risk Score and Plan: 3 and Ondansetron, Dexamethasone, TIVA and Midazolam  Airway Management Planned: Natural Airway and Nasal Cannula  Additional Equipment: None  Intra-op Plan:   Post-operative Plan: Extubation in OR  Informed Consent: I have reviewed the patients History and Physical, chart, labs and discussed the procedure including the risks, benefits and alternatives for the proposed anesthesia with the patient or authorized representative who has indicated his/her understanding and acceptance.     Dental advisory given  Plan  Discussed with: CRNA and Surgeon  Anesthesia Plan Comments: (Discussed risks of anesthesia with patient, including PONV, sore throat, lip/dental damage. Rare risks discussed as well, such as cardiorespiratory and neurological sequelae. Patient understands.)        Anesthesia Quick Evaluation

## 2019-09-20 NOTE — Op Note (Signed)
Operative note   Surgeon:Roman Sandall [employer] logistics/support/administrative officer: None    Preop diagnosis: Degloving injury distal right second toe    Postop diagnosis: Same    Procedure: Amputation PIPJ right second toe    EBL: Minimal    Anesthesia:local and IV sedation.  Local consisted of a one-to-one mixture of 0.5% bupivacaine and 1% lidocaine plain.  A total of 9 cc was used    Hemostasis: None    Specimen: None    Complications: None    Operative indications:Sheila Ellis is an 39 y.o. that presents today for surgical intervention.  The risks/benefits/alternatives/complications have been discussed and consent has been given.    Procedure:  Patient was brought into the OR and placed on the operating table in thesupine position. After anesthesia was obtained theright lower extremity was prepped and draped in usual sterile fashion.  Attention was directed to the distal right second toe where the distal phalanx of the second toe was completely exposed with the distal tip of the second toe gloved.  There was noted to be necrotic tissue on the very distal aspect.  A dorsal and plantar skin flap was created at the level of the middle phalanx.  Full-thickness incision was taken down and the distal toe was disarticulated at the level of the PIPJ.  This was removed from the surgical field in toto.  The wound was then flushed with copious amounts of irrigation.  Layered closure was then performed with a 3-0 Vicryl for the deeper tissue and a 3-0 nylon for the skin.    Patient tolerated the procedure and anesthesia well.  Was transported from the OR to the PACU with all vital signs stable and vascular status intact. To be discharged per routine protocol.  Will follow up in approximately 1 week in the outpatient clinic.

## 2019-09-20 NOTE — Discharge Instructions (Signed)
AMBULATORY SURGERY  °DISCHARGE INSTRUCTIONS ° ° °1) The drugs that you were given will stay in your system until tomorrow so for the next 24 hours you should not: ° °A) Drive an automobile °B) Make any legal decisions °C) Drink any alcoholic beverage ° ° °2) You may resume regular meals tomorrow.  Today it is better to start with liquids and gradually work up to solid foods. ° °You may eat anything you prefer, but it is better to start with liquids, then soup and crackers, and gradually work up to solid foods. ° ° °3) Please notify your doctor immediately if you have any unusual bleeding, trouble breathing, redness and pain at the surgery site, drainage, fever, or pain not relieved by medication. °4)  ° °5) Your post-operative visit with Dr.                     °           °     is: Date:                        Time:   ° °Please call to schedule your post-operative visit. ° °6) Additional Instructions: ° ° ° ° °[facility] °[facility] ° °POST OPERATIVE INSTRUCTIONS FOR DR. TROXLER, DR. FOWLER, AND DR. BAKER °[facility] ° ° °1. Take your medication as prescribed.  Pain medication should be taken only as needed. ° °2. Keep the dressing clean, dry and intact. ° °3. Keep your foot elevated above the heart level for the first 48 hours. ° °4. Walking to the bathroom and brief periods of walking are acceptable, unless we have instructed you to be non-weight bearing. ° °5. Always wear your post-op shoe when walking.  Always use your crutches if you are to be non-weight bearing. ° °6. Do not take a shower. Baths are permissible as long as the foot is kept out of the water.  ° °7. Every hour you are awake:  °- Bend your knee 15 times. °- Flex foot 15 times °- Massage calf 15 times ° °8. Call [facility] ([phone]) if any of the following problems occur: °- You develop a temperature or fever. °- The bandage becomes saturated with blood. °- Medication does not  stop your pain. °- Injury of the foot occurs. °- Any symptoms of infection including redness, odor, or red streaks running from wound. ° °

## 2021-02-24 IMAGING — DX DG FOOT 2V*R*
2 series · 2 of 2 positions shown · non-contrast
Comparison: None.

CLINICAL DATA: Toe avulsion.

EXAM:
RIGHT FOOT - 2 VIEW

[foot ap]
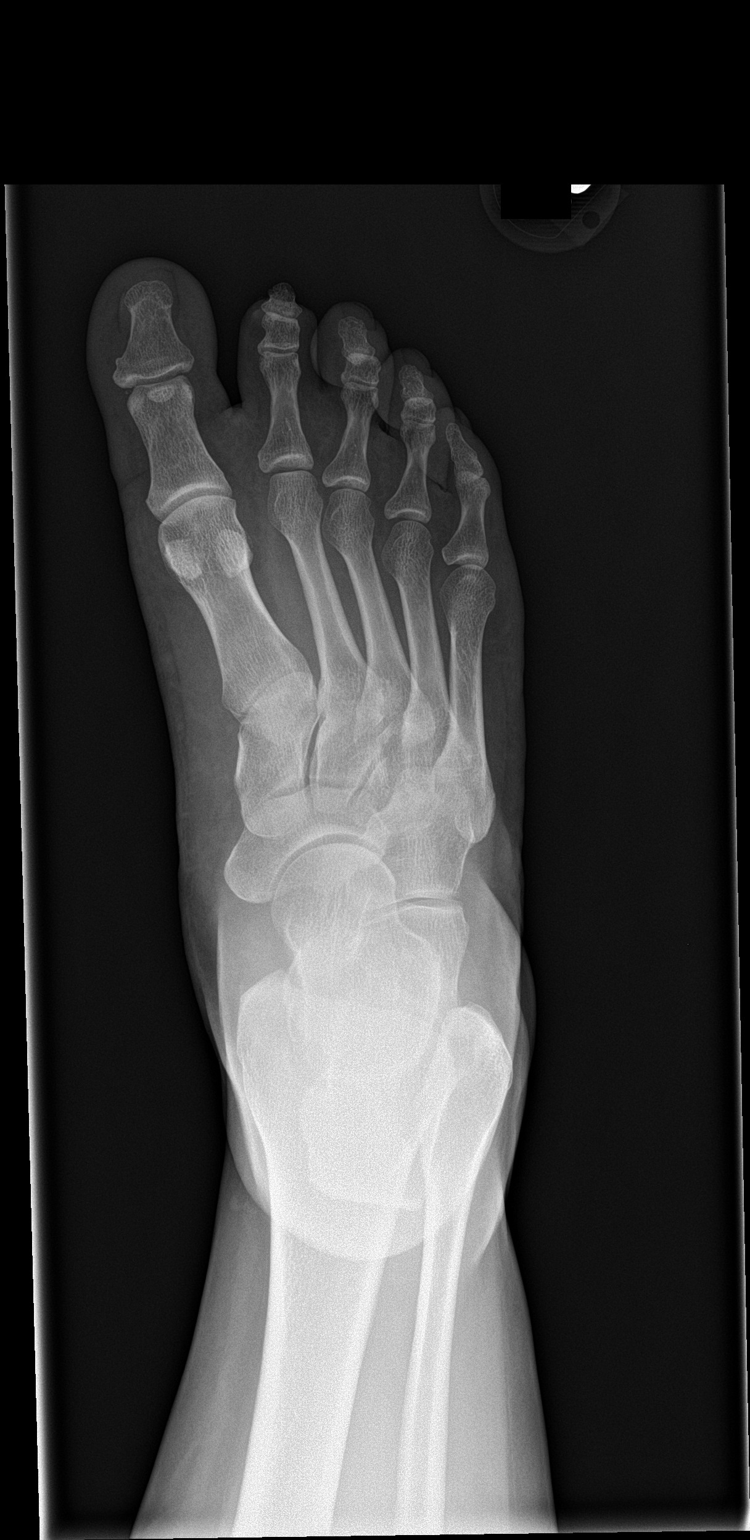

[foot lat]
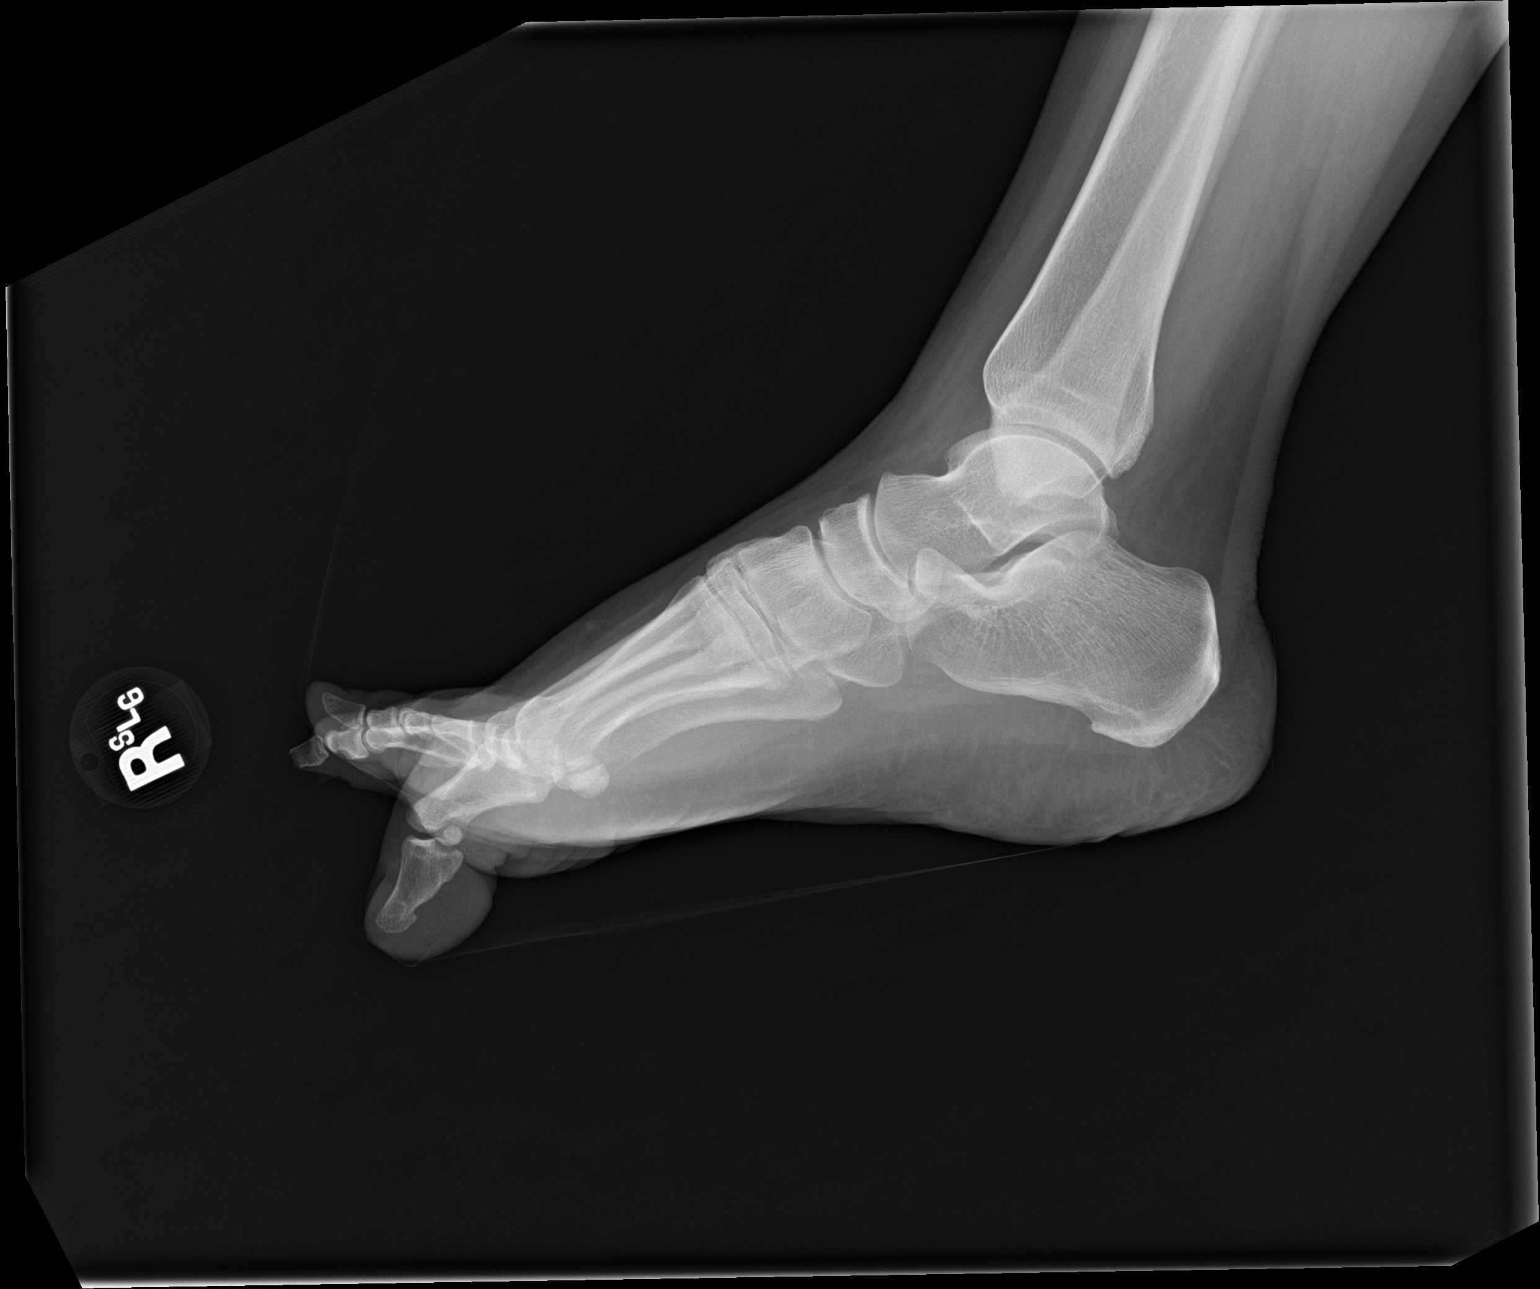

[2 of 2 positions shown; findings below may reference images not displayed]

FINDINGS: The distal phalanx extends beyond the soft tissues of the second toe
consistent with an open injury. On the lateral view, there is
irregularity of the distal phalanx consistent with
fracture/avulsion. Bony fragments are not seen suggesting an
amputation of the distal second toe. No other acute abnormalities
identified.
IMPRESSION: The bone of the distal second toe appears to be exposed, extending
beyond the soft tissues consistent with an open injury. The distal
tip of the second distal phalanx appears to been amputated/avulsed
with no bony fragment seen.
